# Patient Record
Sex: Male | Born: 2005
Health system: Southern US, Community
[De-identification: ages and names within clinical notes are randomized; demographics above are authoritative.]

## PROBLEM LIST (undated history)

## (undated) ENCOUNTER — Emergency Department (HOSPITAL_COMMUNITY): Admission: EM | Payer: 59 | Source: Home / Self Care

## (undated) DIAGNOSIS — J353 Hypertrophy of tonsils with hypertrophy of adenoids: Secondary | ICD-10-CM

## (undated) HISTORY — PX: TYMPANOSTOMY TUBE PLACEMENT: SHX32

---
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
## 2024-08-27 ENCOUNTER — Telehealth: Payer: Self-pay | Admitting: Family Medicine

## 2024-08-27 NOTE — Telephone Encounter (Unsigned)
 Copied from CRM [id number]. Topic: Appointments - Scheduling Inquiry for Clinic >> Aug 27, 2024 10:01 AM Tiffini S wrote: Reason for CRM: Patient mother Particia [phone] asked for a call back for transfer of care with her family with Dr. Glendia Fielding. Advised that [facility] is accepting transfer of care.  She is asking for a call back to discuss care under Dr. Fielding.
